# Patient Record
Sex: Female | Born: 1960 | Race: White | Hispanic: No | Marital: Single
Health system: Southern US, Community
[De-identification: ages and names within clinical notes are randomized; demographics above are authoritative.]

---

## 2002-04-09 ENCOUNTER — Encounter: Admission: RE | Admit: 2002-04-09 | Discharge: 2002-04-09 | Payer: Self-pay

## 2003-04-11 ENCOUNTER — Encounter: Admission: RE | Admit: 2003-04-11 | Discharge: 2003-04-11 | Payer: Self-pay | Admitting: Internal Medicine

## 2004-04-24 ENCOUNTER — Encounter: Admission: RE | Admit: 2004-04-24 | Discharge: 2004-04-24 | Payer: Self-pay

## 2006-09-25 ENCOUNTER — Other Ambulatory Visit: Admission: RE | Admit: 2006-09-25 | Discharge: 2006-09-25 | Payer: Self-pay | Admitting: Family Medicine

## 2006-11-18 ENCOUNTER — Encounter: Admission: RE | Admit: 2006-11-18 | Discharge: 2006-11-18 | Payer: Self-pay | Admitting: Family Medicine

## 2007-09-29 ENCOUNTER — Other Ambulatory Visit: Admission: RE | Admit: 2007-09-29 | Discharge: 2007-09-29 | Payer: Self-pay | Admitting: Family Medicine

## 2007-11-30 ENCOUNTER — Encounter: Admission: RE | Admit: 2007-11-30 | Discharge: 2007-11-30 | Payer: Self-pay | Admitting: Family Medicine

## 2008-11-10 ENCOUNTER — Other Ambulatory Visit: Admission: RE | Admit: 2008-11-10 | Discharge: 2008-11-10 | Payer: Self-pay | Admitting: Family Medicine

## 2008-12-26 ENCOUNTER — Encounter: Admission: RE | Admit: 2008-12-26 | Discharge: 2008-12-26 | Payer: Self-pay | Admitting: Family Medicine

## 2009-12-25 ENCOUNTER — Other Ambulatory Visit: Admission: RE | Admit: 2009-12-25 | Discharge: 2009-12-25 | Payer: Self-pay | Admitting: Family Medicine

## 2009-12-28 ENCOUNTER — Encounter: Admission: RE | Admit: 2009-12-28 | Discharge: 2009-12-28 | Payer: Self-pay | Admitting: Family Medicine

## 2010-06-29 ENCOUNTER — Encounter
Admission: RE | Admit: 2010-06-29 | Discharge: 2010-06-29 | Payer: Self-pay | Source: Home / Self Care | Attending: Family Medicine | Admitting: Family Medicine

## 2010-12-21 ENCOUNTER — Other Ambulatory Visit: Payer: Self-pay | Admitting: Family Medicine

## 2010-12-21 DIAGNOSIS — Z1231 Encounter for screening mammogram for malignant neoplasm of breast: Secondary | ICD-10-CM

## 2011-01-07 ENCOUNTER — Other Ambulatory Visit (HOSPITAL_COMMUNITY)
Admission: RE | Admit: 2011-01-07 | Discharge: 2011-01-07 | Disposition: A | Discharge status: Home / Self Care | Payer: Commercial Managed Care - PPO | Source: Ambulatory Visit | Attending: Family Medicine | Admitting: Family Medicine

## 2011-01-07 ENCOUNTER — Other Ambulatory Visit: Payer: Self-pay | Admitting: Family Medicine

## 2011-01-07 DIAGNOSIS — Z1159 Encounter for screening for other viral diseases: Secondary | ICD-10-CM | POA: Insufficient documentation

## 2011-01-07 DIAGNOSIS — Z124 Encounter for screening for malignant neoplasm of cervix: Secondary | ICD-10-CM | POA: Insufficient documentation

## 2011-01-21 ENCOUNTER — Ambulatory Visit
Admission: RE | Admit: 2011-01-21 | Discharge: 2011-01-21 | Disposition: A | Discharge status: Home / Self Care | Payer: Commercial Managed Care - PPO | Source: Ambulatory Visit | Attending: Family Medicine | Admitting: Family Medicine

## 2011-01-21 DIAGNOSIS — Z1231 Encounter for screening mammogram for malignant neoplasm of breast: Secondary | ICD-10-CM

## 2012-02-19 ENCOUNTER — Other Ambulatory Visit: Payer: Self-pay | Admitting: Family Medicine

## 2012-02-19 DIAGNOSIS — Z1231 Encounter for screening mammogram for malignant neoplasm of breast: Secondary | ICD-10-CM

## 2012-03-02 ENCOUNTER — Ambulatory Visit
Admission: RE | Admit: 2012-03-02 | Discharge: 2012-03-02 | Disposition: A | Discharge status: Home / Self Care | Payer: Commercial Managed Care - PPO | Source: Ambulatory Visit | Attending: Family Medicine | Admitting: Family Medicine

## 2012-03-02 DIAGNOSIS — Z1231 Encounter for screening mammogram for malignant neoplasm of breast: Secondary | ICD-10-CM

## 2014-03-30 ENCOUNTER — Other Ambulatory Visit: Payer: Self-pay | Admitting: Family Medicine

## 2014-03-30 ENCOUNTER — Other Ambulatory Visit (HOSPITAL_COMMUNITY)
Admission: RE | Admit: 2014-03-30 | Discharge: 2014-03-30 | Disposition: A | Discharge status: Home / Self Care | Payer: Commercial Managed Care - PPO | Source: Ambulatory Visit | Attending: Family Medicine | Admitting: Family Medicine

## 2014-03-30 DIAGNOSIS — Z124 Encounter for screening for malignant neoplasm of cervix: Secondary | ICD-10-CM | POA: Insufficient documentation

## 2014-03-30 DIAGNOSIS — Z1151 Encounter for screening for human papillomavirus (HPV): Secondary | ICD-10-CM | POA: Insufficient documentation

## 2014-04-01 LAB — CYTOLOGY - PAP

## 2014-04-05 ENCOUNTER — Other Ambulatory Visit: Payer: Self-pay

## 2014-04-05 DIAGNOSIS — Z1231 Encounter for screening mammogram for malignant neoplasm of breast: Secondary | ICD-10-CM

## 2014-04-28 ENCOUNTER — Ambulatory Visit
Admission: RE | Admit: 2014-04-28 | Discharge: 2014-04-28 | Disposition: A | Discharge status: Home / Self Care | Payer: Commercial Managed Care - PPO | Source: Ambulatory Visit

## 2014-04-28 ENCOUNTER — Encounter (INDEPENDENT_AMBULATORY_CARE_PROVIDER_SITE_OTHER): Payer: Self-pay

## 2014-04-28 DIAGNOSIS — Z1231 Encounter for screening mammogram for malignant neoplasm of breast: Secondary | ICD-10-CM

## 2015-06-16 ENCOUNTER — Other Ambulatory Visit: Payer: Self-pay

## 2015-06-16 DIAGNOSIS — Z1231 Encounter for screening mammogram for malignant neoplasm of breast: Secondary | ICD-10-CM

## 2015-07-07 ENCOUNTER — Ambulatory Visit
Admission: RE | Admit: 2015-07-07 | Discharge: 2015-07-07 | Disposition: A | Discharge status: Home / Self Care | Payer: Commercial Managed Care - PPO | Source: Ambulatory Visit

## 2015-07-07 DIAGNOSIS — Z1231 Encounter for screening mammogram for malignant neoplasm of breast: Secondary | ICD-10-CM

## 2016-07-15 ENCOUNTER — Other Ambulatory Visit: Payer: Self-pay | Admitting: Family Medicine

## 2016-07-15 DIAGNOSIS — Z1231 Encounter for screening mammogram for malignant neoplasm of breast: Secondary | ICD-10-CM

## 2016-07-16 ENCOUNTER — Ambulatory Visit
Admission: RE | Admit: 2016-07-16 | Discharge: 2016-07-16 | Disposition: A | Discharge status: Home / Self Care | Payer: Commercial Managed Care - PPO | Source: Ambulatory Visit | Attending: Family Medicine | Admitting: Family Medicine

## 2016-07-16 DIAGNOSIS — Z1231 Encounter for screening mammogram for malignant neoplasm of breast: Secondary | ICD-10-CM

## 2016-08-27 ENCOUNTER — Other Ambulatory Visit: Payer: Self-pay | Admitting: Family Medicine

## 2016-08-27 ENCOUNTER — Other Ambulatory Visit (HOSPITAL_COMMUNITY)
Admission: RE | Admit: 2016-08-27 | Discharge: 2016-08-27 | Disposition: A | Discharge status: Home / Self Care | Payer: Commercial Managed Care - PPO | Source: Ambulatory Visit | Attending: Family Medicine | Admitting: Family Medicine

## 2016-08-27 DIAGNOSIS — Z01411 Encounter for gynecological examination (general) (routine) with abnormal findings: Secondary | ICD-10-CM | POA: Insufficient documentation

## 2016-08-27 DIAGNOSIS — Z1151 Encounter for screening for human papillomavirus (HPV): Secondary | ICD-10-CM | POA: Diagnosis not present

## 2016-08-30 LAB — CYTOLOGY - PAP
Diagnosis: NEGATIVE
HPV (WINDOPATH): NOT DETECTED

## 2017-06-16 ENCOUNTER — Other Ambulatory Visit: Payer: Self-pay | Admitting: Family Medicine

## 2017-06-16 DIAGNOSIS — Z139 Encounter for screening, unspecified: Secondary | ICD-10-CM

## 2017-07-28 ENCOUNTER — Ambulatory Visit
Admission: RE | Admit: 2017-07-28 | Discharge: 2017-07-28 | Disposition: A | Discharge status: Home / Self Care | Payer: Commercial Managed Care - PPO | Source: Ambulatory Visit | Attending: Family Medicine | Admitting: Family Medicine

## 2017-07-28 DIAGNOSIS — Z139 Encounter for screening, unspecified: Secondary | ICD-10-CM

## 2018-06-19 ENCOUNTER — Other Ambulatory Visit: Payer: Self-pay | Admitting: Family Medicine

## 2018-06-19 DIAGNOSIS — Z1231 Encounter for screening mammogram for malignant neoplasm of breast: Secondary | ICD-10-CM

## 2018-08-10 ENCOUNTER — Ambulatory Visit
Admission: RE | Admit: 2018-08-10 | Discharge: 2018-08-10 | Disposition: A | Discharge status: Home / Self Care | Payer: Commercial Managed Care - PPO | Source: Ambulatory Visit | Attending: Family Medicine | Admitting: Family Medicine

## 2018-08-10 DIAGNOSIS — Z1231 Encounter for screening mammogram for malignant neoplasm of breast: Secondary | ICD-10-CM

## 2018-09-23 ENCOUNTER — Emergency Department (HOSPITAL_COMMUNITY): Payer: Worker's Compensation

## 2018-09-23 ENCOUNTER — Emergency Department (HOSPITAL_COMMUNITY): Payer: Worker's Compensation | Admitting: Certified Registered Nurse Anesthetist

## 2018-09-23 ENCOUNTER — Encounter (HOSPITAL_COMMUNITY)
Admission: EM | Disposition: A | Discharge status: Home / Self Care | Payer: Self-pay | Source: Home / Self Care | Attending: Emergency Medicine

## 2018-09-23 ENCOUNTER — Encounter (HOSPITAL_COMMUNITY): Payer: Self-pay

## 2018-09-23 ENCOUNTER — Other Ambulatory Visit: Payer: Self-pay

## 2018-09-23 ENCOUNTER — Ambulatory Visit (HOSPITAL_COMMUNITY)
Admission: EM | Admit: 2018-09-23 | Discharge: 2018-09-23 | Disposition: A | Discharge status: Home / Self Care | Payer: Worker's Compensation | Attending: Orthopedic Surgery | Admitting: Orthopedic Surgery

## 2018-09-23 DIAGNOSIS — Y990 Civilian activity done for income or pay: Secondary | ICD-10-CM | POA: Insufficient documentation

## 2018-09-23 DIAGNOSIS — S52502A Unspecified fracture of the lower end of left radius, initial encounter for closed fracture: Secondary | ICD-10-CM

## 2018-09-23 DIAGNOSIS — G5602 Carpal tunnel syndrome, left upper limb: Secondary | ICD-10-CM | POA: Diagnosis not present

## 2018-09-23 DIAGNOSIS — Y92511 Restaurant or cafe as the place of occurrence of the external cause: Secondary | ICD-10-CM | POA: Insufficient documentation

## 2018-09-23 DIAGNOSIS — W010XXA Fall on same level from slipping, tripping and stumbling without subsequent striking against object, initial encounter: Secondary | ICD-10-CM | POA: Insufficient documentation

## 2018-09-23 DIAGNOSIS — S52552A Other extraarticular fracture of lower end of left radius, initial encounter for closed fracture: Secondary | ICD-10-CM | POA: Diagnosis not present

## 2018-09-23 HISTORY — PX: ORIF WRIST FRACTURE: SHX2133

## 2018-09-23 HISTORY — PX: CARPAL TUNNEL RELEASE: SHX101

## 2018-09-23 SURGERY — OPEN REDUCTION INTERNAL FIXATION (ORIF) WRIST FRACTURE
Anesthesia: Monitor Anesthesia Care | Site: Wrist | Laterality: Left

## 2018-09-23 MED ORDER — SODIUM CHLORIDE 0.9 % IV BOLUS
1000.0000 mL | Freq: Once | INTRAVENOUS | Status: AC
  Administered 2018-09-23: 1000 mL via INTRAVENOUS

## 2018-09-23 MED ORDER — HYDROCODONE-ACETAMINOPHEN 5-325 MG PO TABS: 1.0000 | ORAL_TABLET | ORAL | 0 refills | Status: AC | PRN

## 2018-09-23 MED ORDER — PROPOFOL 500 MG/50ML IV EMUL
INTRAVENOUS | Status: DC | PRN
  Administered 2018-09-23: 100 ug/kg/min via INTRAVENOUS

## 2018-09-23 MED ORDER — MIDAZOLAM HCL 2 MG/2ML IJ SOLN
INTRAMUSCULAR | Status: AC
  Filled 2018-09-23: qty 2

## 2018-09-23 MED ORDER — FENTANYL CITRATE (PF) 100 MCG/2ML IJ SOLN
INTRAMUSCULAR | Status: DC | PRN
  Administered 2018-09-23: 75 ug via INTRAVENOUS
  Administered 2018-09-23: 25 ug via INTRAVENOUS

## 2018-09-23 MED ORDER — LACTATED RINGERS IV SOLN
INTRAVENOUS | Status: DC
  Administered 2018-09-23 (×2): via INTRAVENOUS

## 2018-09-23 MED ORDER — LIDOCAINE HCL (PF) 1 % IJ SOLN
10.0000 mL | Freq: Once | INTRAMUSCULAR | Status: AC
  Administered 2018-09-23: 13:00:00 10 mL via INTRADERMAL
  Filled 2018-09-23: qty 10

## 2018-09-23 MED ORDER — FENTANYL CITRATE (PF) 250 MCG/5ML IJ SOLN
INTRAMUSCULAR | Status: AC
  Filled 2018-09-23: qty 5

## 2018-09-23 MED ORDER — FENTANYL CITRATE (PF) 100 MCG/2ML IJ SOLN
INTRAMUSCULAR | Status: AC
  Filled 2018-09-23: qty 2

## 2018-09-23 MED ORDER — MIDAZOLAM HCL 5 MG/5ML IJ SOLN
INTRAMUSCULAR | Status: DC | PRN
  Administered 2018-09-23: 2 mg via INTRAVENOUS

## 2018-09-23 MED ORDER — 0.9 % SODIUM CHLORIDE (POUR BTL) OPTIME
TOPICAL | Status: DC | PRN
  Administered 2018-09-23: 17:00:00 1000 mL

## 2018-09-23 MED ORDER — MORPHINE SULFATE (PF) 4 MG/ML IV SOLN
4.0000 mg | Freq: Once | INTRAVENOUS | Status: AC
Start: 2018-09-23 — End: 2018-09-23
  Administered 2018-09-23: 13:00:00 4 mg via INTRAVENOUS
  Filled 2018-09-23: qty 1

## 2018-09-23 MED ORDER — PROPOFOL 10 MG/ML IV BOLUS
INTRAVENOUS | Status: DC | PRN
  Administered 2018-09-23 (×3): 20 mg via INTRAVENOUS
  Administered 2018-09-23 (×2): 30 mg via INTRAVENOUS
  Administered 2018-09-23 (×3): 20 mg via INTRAVENOUS

## 2018-09-23 MED ORDER — ONDANSETRON HCL 4 MG/2ML IJ SOLN
4.0000 mg | Freq: Once | INTRAMUSCULAR | Status: AC
  Administered 2018-09-23: 13:00:00 4 mg via INTRAVENOUS
  Filled 2018-09-23: qty 2

## 2018-09-23 MED ORDER — MORPHINE SULFATE (PF) 4 MG/ML IV SOLN
4.0000 mg | Freq: Once | INTRAVENOUS | Status: AC
  Administered 2018-09-23: 13:00:00 4 mg via INTRAVENOUS
  Filled 2018-09-23: qty 1

## 2018-09-23 MED ORDER — CEFAZOLIN SODIUM-DEXTROSE 2-4 GM/100ML-% IV SOLN
2.0000 g | INTRAVENOUS | Status: AC
  Administered 2018-09-23: 17:00:00 2 g via INTRAVENOUS
  Filled 2018-09-23: qty 100

## 2018-09-23 MED ORDER — LIDOCAINE HCL 1 % IJ SOLN: 10.0000 mL | Freq: Once | INTRAMUSCULAR | Status: DC

## 2018-09-23 MED ORDER — POVIDONE-IODINE 10 % EX SWAB: 2.0000 "application " | Freq: Once | CUTANEOUS | Status: DC

## 2018-09-23 MED ORDER — CHLORHEXIDINE GLUCONATE 4 % EX LIQD
60.0000 mL | Freq: Once | CUTANEOUS | Status: DC
  Filled 2018-09-23: qty 60

## 2018-09-23 MED ORDER — PROPOFOL 10 MG/ML IV BOLUS
INTRAVENOUS | Status: AC
  Filled 2018-09-23: qty 20

## 2018-09-23 SURGICAL SUPPLY — 71 items
BANDAGE ACE 3X5.8 VEL STRL LF (GAUZE/BANDAGES/DRESSINGS) ×3 IMPLANT
BANDAGE ACE 4X5 VEL STRL LF (GAUZE/BANDAGES/DRESSINGS) ×3 IMPLANT
BIT DRILL 2.0 LNG QUCK RELEASE (BIT) ×1 IMPLANT
BIT DRILL 2.8 QUICK RELEASE (BIT) ×1 IMPLANT
BLADE CLIPPER SURG (BLADE) IMPLANT
BNDG CMPR 9X4 STRL LF SNTH (GAUZE/BANDAGES/DRESSINGS) ×1
BNDG ESMARK 4X9 LF (GAUZE/BANDAGES/DRESSINGS) ×3 IMPLANT
BNDG GAUZE ELAST 4 BULKY (GAUZE/BANDAGES/DRESSINGS) ×3 IMPLANT
CANISTER SUCT 3000ML PPV (MISCELLANEOUS) ×3 IMPLANT
CORDS BIPOLAR (ELECTRODE) ×3 IMPLANT
COVER SURGICAL LIGHT HANDLE (MISCELLANEOUS) ×3 IMPLANT
COVER WAND RF STERILE (DRAPES) ×3 IMPLANT
CUFF TOURNIQUET SINGLE 18IN (TOURNIQUET CUFF) ×3 IMPLANT
CUFF TOURNIQUET SINGLE 24IN (TOURNIQUET CUFF) IMPLANT
DRAIN TLS ROUND 10FR (DRAIN) IMPLANT
DRAPE OEC MINIVIEW 54X84 (DRAPES) ×2 IMPLANT
DRAPE SURG 17X23 STRL (DRAPES) ×3 IMPLANT
DRILL 2.0 LNG QUICK RELEASE (BIT) ×3
DRILL 2.8 QUICK RELEASE (BIT) ×3
DRSG XEROFORM 1X8 (GAUZE/BANDAGES/DRESSINGS) ×2 IMPLANT
GAUZE SPONGE 4X4 12PLY STRL (GAUZE/BANDAGES/DRESSINGS) ×3 IMPLANT
GAUZE XEROFORM 1X8 LF (GAUZE/BANDAGES/DRESSINGS) ×1 IMPLANT
GLOVE BIOGEL M 8.0 STRL (GLOVE) ×3 IMPLANT
GLOVE SS BIOGEL STRL SZ 8 (GLOVE) ×1 IMPLANT
GLOVE SUPERSENSE BIOGEL SZ 8 (GLOVE) ×2
GLOVE SURG SS PI 7.5 STRL IVOR (GLOVE) ×4 IMPLANT
GOWN STRL REUS W/ TWL LRG LVL3 (GOWN DISPOSABLE) ×1 IMPLANT
GOWN STRL REUS W/ TWL XL LVL3 (GOWN DISPOSABLE) ×2 IMPLANT
GOWN STRL REUS W/TWL LRG LVL3 (GOWN DISPOSABLE) ×3
GOWN STRL REUS W/TWL XL LVL3 (GOWN DISPOSABLE) ×6
GUIDEWIRE ORTHO 0.054X6 (WIRE) ×6 IMPLANT
KIT BASIN OR (CUSTOM PROCEDURE TRAY) ×3 IMPLANT
KIT TURNOVER KIT B (KITS) ×3 IMPLANT
LOOP VESSEL MAXI BLUE (MISCELLANEOUS) IMPLANT
MANIFOLD NEPTUNE II (INSTRUMENTS) ×3 IMPLANT
NEEDLE 22X1 1/2 (OR ONLY) (NEEDLE) IMPLANT
NS IRRIG 1000ML POUR BTL (IV SOLUTION) ×3 IMPLANT
PACK ORTHO EXTREMITY (CUSTOM PROCEDURE TRAY) ×3 IMPLANT
PAD ARMBOARD 7.5X6 YLW CONV (MISCELLANEOUS) ×6 IMPLANT
PAD CAST 3X4 CTTN HI CHSV (CAST SUPPLIES) ×1 IMPLANT
PAD CAST 4YDX4 CTTN HI CHSV (CAST SUPPLIES) ×1 IMPLANT
PADDING CAST COTTON 3X4 STRL (CAST SUPPLIES) ×3
PADDING CAST COTTON 4X4 STRL (CAST SUPPLIES) ×3
PLATE ACU LOC PROX STD LEFT (Plate) ×2 IMPLANT
SCREW 2.3X12MM (Screw) ×2 IMPLANT
SCREW BN FT 16X2.3XLCK HEX CRT (Screw) IMPLANT
SCREW CORT FT 18X2.3XLCK HEX (Screw) IMPLANT
SCREW CORT FT 20X2.3XLCK HEX (Screw) IMPLANT
SCREW CORTICAL LOCKING 2.3X16M (Screw) ×3 IMPLANT
SCREW CORTICAL LOCKING 2.3X18M (Screw) ×3 IMPLANT
SCREW CORTICAL LOCKING 2.3X20M (Screw) ×3 IMPLANT
SCREW HEXALOBE LOCKING 3.5X14M (Screw) ×2 IMPLANT
SCREW LOCK 12X3.5X HEXALOBE (Screw) ×1 IMPLANT
SCREW LOCKING 3.5X12 (Screw) ×3 IMPLANT
SCREW NON TOGGLE 2.3X26 (Screw) ×2 IMPLANT
SCREW NONLOCK HEX 3.5X12 (Screw) ×2 IMPLANT
SCRUB BETADINE 4OZ XXX (MISCELLANEOUS) ×3 IMPLANT
SOL PREP POV-IOD 4OZ 10% (MISCELLANEOUS) ×3 IMPLANT
SPONGE LAP 4X18 RFD (DISPOSABLE) IMPLANT
SUT MNCRL AB 4-0 PS2 18 (SUTURE) ×3 IMPLANT
SUT PROLENE 3 0 PS 2 (SUTURE) IMPLANT
SUT PROLENE 4 0 PS 2 18 (SUTURE) ×4 IMPLANT
SUT VIC AB 3-0 FS2 27 (SUTURE) IMPLANT
SYR CONTROL 10ML LL (SYRINGE) IMPLANT
SYSTEM CHEST DRAIN TLS 7FR (DRAIN) IMPLANT
TOWEL OR 17X24 6PK STRL BLUE (TOWEL DISPOSABLE) ×3 IMPLANT
TOWEL OR 17X26 10 PK STRL BLUE (TOWEL DISPOSABLE) ×3 IMPLANT
TUBE CONNECTING 12'X1/4 (SUCTIONS) ×1
TUBE CONNECTING 12X1/4 (SUCTIONS) ×2 IMPLANT
TUBE EVACUATION TLS (MISCELLANEOUS) ×3 IMPLANT
WATER STERILE IRR 1000ML POUR (IV SOLUTION) ×3 IMPLANT

## 2018-09-23 NOTE — Consult Note (Addendum)
Reason for Consult:Left wrist fx Referring Physician: Cindie LarocheJ Haviland  Savannah Juarez is an 58 y.o. female.  HPI: Savannah Juarez was working at [employer] when she slipped on a drink lid and fell. She put out her left hand to break her fall and fell onto her outstretched hand. She had immediate pain and came to the ED for evaluation. X-rays showed a distal radius fx and hand surgery was consulted. She is LHD.  History reviewed. No pertinent past medical history.  History reviewed. No pertinent surgical history.  Family History  Problem Relation Age of Onset  . Breast cancer Neg Hx     Social History:  has no history on file for tobacco, alcohol, and drug.  Allergies: No Known Allergies  Medications: I have reviewed the patient's current medications.  No results found for this or any previous visit (from the past 48 hour(s)).  Dg Wrist Complete Left  Result Date: 09/23/2018 CLINICAL DATA:  Larey SeatFell on left breast today with deformity. EXAM: LEFT WRIST - COMPLETE 3+ VIEW COMPARISON:  None. FINDINGS: There is a moderately displaced transverse fracture with mild comminution of the distal radial metaphysis with associated impaction. Moderate dorsal angulation of the predominant distal fracture fragment. Degenerative change over the radial side of the carpal bones. IMPRESSION: Displaced slightly comminuted distal radial metaphyseal fracture. Electronically Signed   By: Elberta Fortisaniel  Boyle M.D.   On: 09/23/2018 12:36    Review of Systems  Constitutional: Negative for weight loss.  HENT: Negative for ear discharge, ear pain, hearing loss and tinnitus.   Eyes: Negative for blurred vision, double vision, photophobia and pain.  Respiratory: Negative for cough, sputum production and shortness of breath.   Cardiovascular: Negative for chest pain.  Gastrointestinal: Negative for abdominal pain, nausea and vomiting.  Genitourinary: Negative for dysuria, flank pain, frequency and urgency.  Musculoskeletal: Positive  for joint pain (Left wrist). Negative for back pain, falls, myalgias and neck pain.  Neurological: Negative for dizziness, tingling, sensory change, focal weakness, loss of consciousness and headaches.  Endo/Heme/Allergies: Does not bruise/bleed easily.  Psychiatric/Behavioral: Negative for depression, memory loss and substance abuse. The patient is not nervous/anxious.    Blood pressure (!) 126/96, pulse 77, temperature 98.2 F (36.8 C), temperature source Oral, resp. rate 16, last menstrual period 07/16/2016, SpO2 98 %. Physical Exam  Constitutional: She appears well-developed and well-nourished. No distress.  HENT:  Head: Normocephalic and atraumatic.  Eyes: Conjunctivae are normal. Right eye exhibits no discharge. Left eye exhibits no discharge. No scleral icterus.  Neck: Normal range of motion.  Cardiovascular: Normal rate and regular rhythm.  Respiratory: Effort normal. No respiratory distress.  Musculoskeletal:     Comments: Left shoulder, elbow, wrist, digits- no skin wounds, wrist exquisitely TTP and pain with AROM/PROM, deformed  Sens  Ax/R/M/U intact  Mot   Ax/ R/ PIN/ M/ AIN/ U intact  Rad 2+  Neurological: She is alert.  Skin: Skin is warm and dry. She is not diaphoretic.  Psychiatric: She has a normal mood and affect. Her behavior is normal.    Assessment/Plan: Left wrist fx -- Plan for ORIF this afternoon by Dr. Roney Mansreighton. NPO until then. Anticipate discharge after surgery. HTN    Freeman CaldronMichael J. Jeffery, PA-C Orthopedic Surgery [phone] 09/23/2018, 1:24 PM     ** Agree with note as stated above with the following modification: Patient states that since the fall she has had paresthesia to the median nerve distribution. This has not resolved since the injury. We will plan to proceed  with L distal radius open reduction and internal fixation and a left carpal tunnel release for acute carpal tunnel syndrome. The risk, benefits and alternatives were discussed with her in  the preoperative area.  These include but are not limited to infection, bleeding, damage to surrounding structures occluding blood vessels nerves, pains, stiffness, malunion, nonunion, hardware failure and need for additional procedures.  Informed consent was obtained and the patient's left wrist was marked with surgical marking pen.  We will plan on discharge home postoperatively and follow-up with me in approximately 10 to 14 days.

## 2018-09-23 NOTE — Progress Notes (Signed)
Orthopedic Tech Progress Note Patient Details:  Savannah Juarez 18-Aug-1960 [id number]  Ortho Devices Type of Ortho Device: Short arm splint Ortho Device/Splint Interventions: Ordered, Application, Adjustment   Post Interventions Patient Tolerated: Well   Caterra Ostroff Danella Penton 09/23/2018, 2:05 PM

## 2018-09-23 NOTE — ED Notes (Signed)
Belongings placed in locker 6 in PURPLE ZONE. Valuables with security

## 2018-09-23 NOTE — ED Triage Notes (Signed)
Pt reports slipped on lid at work and fell onto buttocks, pt tried to catch self on left hand. Pt left wrist is swollen. Pt denies hitting head and denies LOC. Pt alert and oriented.

## 2018-09-23 NOTE — ED Notes (Signed)
Patient transported to X-ray 

## 2018-09-23 NOTE — Transfer of Care (Signed)
Immediate Anesthesia Transfer of Care Note  Patient: Savannah Juarez  Procedure(s) Performed: OPEN REDUCTION INTERNAL FIXATION (ORIF) WRIST FRACTURE (Left Wrist) Carpal Tunnel Release (Left Wrist)  Patient Location: PACU  Anesthesia Type:Regional  Level of Consciousness: awake  Airway & Oxygen Therapy: Patient Spontanous Breathing and Patient connected to face mask oxygen  Post-op Assessment: Report given to RN and Post -op Vital signs reviewed and stable  Post vital signs: Reviewed and stable  Last Vitals:  Vitals Value Taken Time  BP 131/66 09/23/2018  5:58 PM  Temp    Pulse 87 09/23/2018  6:00 PM  Resp 20 09/23/2018  6:00 PM  SpO2 98 % 09/23/2018  6:00 PM  Vitals shown include unvalidated device data.  Last Pain:  Vitals:   09/23/18 1216  TempSrc:   PainSc: 9          Complications: No apparent anesthesia complications

## 2018-09-23 NOTE — ED Notes (Signed)
Ortho paged. 

## 2018-09-23 NOTE — ED Notes (Signed)
M. Jeffery, PA at bedside 

## 2018-09-23 NOTE — Op Note (Signed)
PREOPERATIVE DIAGNOSIS: 1.  Left displaced distal radius fracture, extra-articular 2.  Acute left carpal tunnel syndrome  POSTOPERATIVE DIAGNOSIS: Same  ATTENDING PHYSICIAN: Maudry Mayhew. Jeannie Fend, III, MD who was present and scrubbed for the entire case   ASSISTANT SURGEON: None.   ANESTHESIA: Regional with MAC sedation  SURGICAL PROCEDURES: 1.  Open reduction internal fixation left displaced extra-articular distal radius fracture 2.  Left carpal tunnel release  SURGICAL INDICATIONS: Patient is a 58 year old female who presented to the [facility] ER earlier today.  She had a fall while at work and landed on outstretched left arm.  She was found to have a significantly displaced extra-articular distal radius fracture.  Additionally she states that since the fall she has had numbness and tingling to the thumb, index and middle fingers.  She denies any numbness or tingling prior to the fall and has failed to improve since the injury.  I discussed treatment options for her and did recommend proceeding forward with open reduction and internal fixation of her distal radius fracture.  Additionally we did discuss the risks and benefits of a left carpal tunnel release to alleviate the symptoms of her thumb index and middle finger paresthesias.  Through this discussion she did agree to proceed with both.  FINDING: Patient had a significantly displaced left extra-articular distal radius fracture.  This was subsequently reduced to near anatomic alignment and held in place with a Acumed volar locking plate.  Additionally, left carpal tunnel release was performed with mild compression of the median nerve within the carpal tunnel  DESCRIPTION OF PROCEDURE: Patient was identified in the preoperative holding area where the risk benefits and alternatives of the procedure were discussed with the patient.  These include but are not limited to infection, bleeding, damage to surrounding structures including blood  vessels and nerves, pain, stiffness, hardware failure, malunion, nonunion and need for additional procedures.  Informed consent was obtained at that time patient's left wrist was marked with surgical marking pen.  She then had a left upper extremity plexus block performed by anesthesia.  She was subsequently brought to the operative suite where a timeout was performed identifying the correct patient operative site.  She was positioned supine operative table with her hand outstretched on a hand table.  A tourniquet was placed on the upper arm and the patient was induced under MAC sedation.  Left upper extremity was then prepped and draped in usual sterile fashion  The limb was exsanguinated and the tourniquet was inflated.  A standard volar FCR approach was then utilized with a longitudinal incision overlying the FCR tendon.  Sharp dissection was carried down through the subcutaneous tissue to the level of the tendon which had its sheath incised.  The tendon was mobilized medially and the sub-sheath was once again incised with a 15 blade.  The underlying FPL tendon was mobilized medially as well exposing the deep distal radius and its fracture.  The pronator quadratus was elevated off the distal radius both proximal and distal to the fracture site.  With longitudinal traction, ulnar deviation and dorsal pressure on the proximal fragment the radius fracture was reduced and pinned in the place with a 0.054 K wire.  Fluoroscopic images were obtained which showed near anatomic reduction.  The Acumed volar locking plate was then placed in appropriate position which was confirmed under fluoroscopic imaging.  A single distal bicortical screw was placed to reduce the plate down to the bone.  All holes distally were then filled using unicortical  locking screws in standard fashion.  The previously placed bicortical screw was replaced with a locking unicortical screw.  The plate was then reduced to the bone proximately  utilizing a kickstand technique to achieve further volar angulation of the fracture.  1 cortical screw followed by 2 locking screws were then placed securing the plate to the proximal shaft.  Fluoroscopic images were obtained which showed near anatomic reduction of the distal radius fracture as well as appropriate plate positioning and screw length dorsally.  There was a single locking screw in the radial styloid which was prominent and removed.  The DRUJ was evaluated in both pronation and supination and found to be stable.  The wound was then copiously irrigated with normal saline and the skin was closed with interrupted 4-0 Prolene sutures.  Attention was then turned to the palm.  A longitudinal incision was made in the palm just distal to the flexion crease in line with the fourth digit.  Dissection was carried down through the subcutaneous tissue and the palmar aponeurosis was divided longitudinally in line with the incision.  Deep to this the transverse carpal ligament was identified and carefully transected from distal to proximal.  Care was taken to protect the deep median nerve as well as the flexor tendons.  Once the ligament had been released up to the level of the skin incision the Indianatome's were utilized in sequential fashion dilating the remainder of the carpal tunnel.  The Biomet carpal tunnel release device was then inserted and the blade was advanced in a controlled fashion transecting the remainder of the proximal carpal tunnel.  A Valora Corporal was used to visualize the release and found to have complete release of the transverse carpal ligament.  There was no damage to the deep neurologic or tendinous structures.  This wound was then copiously irrigated with normal saline and the skin was closed with interrupted 4-0 Prolene's.  Xeroform, 4 x 4's and a well-padded volar splint were then applied.  The tourniquet was released and the patient had return of brisk cap refill to all of her digits.  Awoken  from her anesthesia and taken to the PACU in stable condition.  She tolerated the procedure well and there were no complications.  RADIOGRAPHIC INTERPRETATION: 3 views of the left wrist were obtained intraoperatively under fluoroscopic image guidance including AP, lateral and fossa lateral views.  These show interval near anatomic reduction of the distal radius fracture with volar plate placement.  All screw lengths appear to be appropriate and extra-articular.  There are no other fractures or dislocations noted.  ESTIMATED BLOOD LOSS: 10 mL's  TOURNIQUET TIME: 62 minutes  SPECIMENS: None  POSTOPERATIVE PLAN: The patient will be discharged home and seen back  in the office in approximately 10-12 days for wound check, suture  removal, and then be sent to a therapist for edema control, early range of motion and volar wrist splint  IMPLANTS: Acumed volar locking plate with cortical and locking screws

## 2018-09-23 NOTE — ED Notes (Signed)
Boss's number for workers comp - [phone]. Name - Savannah Juarez

## 2018-09-23 NOTE — Discharge Instructions (Signed)
Discharge Instructions  - Keep dressings in place. Do not remove them. - The dressings must stay dry - Take all medication as prescribed. Transition to over the counter pain medication as your pain improves - Keep the hand elevated over the next 48-72 hours to help with pain and swelling - Move all digits not restricted by the dressings regularly to prevent stiffness - Please call to schedule a follow up appointment with Dr. Roney Mans at [phone] for 10-14 days following surgery\ - Your pain medication have been sent digitally to your pharmacy

## 2018-09-23 NOTE — Anesthesia Procedure Notes (Signed)
Anesthesia Regional Block: Supraclavicular block   Pre-Anesthetic Checklist: ,, timeout performed, Correct Patient, Correct Site, Correct Laterality, Correct Procedure, Correct Position, site marked, Risks and benefits discussed,  Surgical consent,  Pre-op evaluation,  At surgeon's request and post-op pain management  Laterality: Left  Prep: chloraprep       Needles:  Injection technique: Single-shot  Needle Type: Stimulator Needle - 80          Additional Needles:   Procedures:, nerve stimulator,,, ultrasound used (permanent image in chart),,,,  Narrative:  Start time: 09/23/2018 3:55 PM End time: 09/23/2018 4:05 PM Injection made incrementally with aspirations every 5 mL.  Performed by: Personally   Additional Notes: 20 cc 0.75% Ropivacaine injected easily

## 2018-09-23 NOTE — Anesthesia Preprocedure Evaluation (Signed)
Anesthesia Evaluation  Patient identified by MRN, date of birth, ID band Patient awake    Reviewed: Allergy & Precautions, NPO status , Patient's Chart, lab work & pertinent test results  Airway Mallampati: II  TM Distance: >3 FB Neck ROM: Full    Dental  (+) Teeth Intact, Dental Advisory Given   Pulmonary    breath sounds clear to auscultation       Cardiovascular  Rhythm:Regular Rate:Normal     Neuro/Psych    GI/Hepatic   Endo/Other    Renal/GU      Musculoskeletal   Abdominal   Peds  Hematology   Anesthesia Other Findings   Reproductive/Obstetrics                             Anesthesia Physical Anesthesia Plan  ASA: II  Anesthesia Plan: MAC and Regional   Post-op Pain Management:    Induction: Intravenous  PONV Risk Score and Plan: Ondansetron and Dexamethasone  Airway Management Planned: Simple Face Mask and Natural Airway  Additional Equipment:   Intra-op Plan:   Post-operative Plan:   Informed Consent: I have reviewed the patients History and Physical, chart, labs and discussed the procedure including the risks, benefits and alternatives for the proposed anesthesia with the patient or authorized representative who has indicated his/her understanding and acceptance.     Dental advisory given  Plan Discussed with: CRNA and Anesthesiologist  Anesthesia Plan Comments:         Anesthesia Quick Evaluation

## 2018-09-23 NOTE — ED Provider Notes (Signed)
[facility] EMERGENCY DEPARTMENT Provider Note   CSN: [id number] Arrival date & time: 09/23/18  1203    History   Chief Complaint Chief Complaint  Patient presents with  . Hand Injury    HPI Atiyana C Juarez is a 58 y.o. female.     Pt presents to the ED today with left wrist pain.  The pt said she slipped while at work and landed on her left wrist.  The pt denies any other injury.     History reviewed. No pertinent past medical history.  There are no active problems to display for this patient.   History reviewed. No pertinent surgical history.   OB History   No obstetric history on file.      Home Medications    Prior to Admission medications   Not on File    Family History Family History  Problem Relation Age of Onset  . Breast cancer Neg Hx     Social History Social History   Tobacco Use  . Smoking status: Not on file  Substance Use Topics  . Alcohol use: Not on file  . Drug use: Not on file     Allergies   Patient has no known allergies.   Review of Systems Review of Systems  Musculoskeletal:       Wrist pain  All other systems reviewed and are negative.    Physical Exam Updated Vital Signs BP (!) 126/96 (BP Location: Right Arm)   Pulse 77   Temp 98.2 F (36.8 C) (Oral)   Resp 16   LMP 07/16/2016 (Exact Date)   SpO2 98%   Physical Exam Vitals signs and nursing note reviewed.  Constitutional:      Appearance: Normal appearance.  HENT:     Head: Normocephalic and atraumatic.     Right Ear: External ear normal.     Left Ear: External ear normal.     Nose: Nose normal.     Mouth/Throat:     Mouth: Mucous membranes are moist.  Eyes:     Extraocular Movements: Extraocular movements intact.     Conjunctiva/sclera: Conjunctivae normal.     Pupils: Pupils are equal, round, and reactive to light.  Neck:     Musculoskeletal: Normal range of motion and neck supple.  Cardiovascular:     Rate and Rhythm:  Normal rate and regular rhythm.     Pulses: Normal pulses.     Heart sounds: Normal heart sounds.  Pulmonary:     Effort: Pulmonary effort is normal.     Breath sounds: Normal breath sounds.  Abdominal:     General: Abdomen is flat. Bowel sounds are normal.     Palpations: Abdomen is soft.  Musculoskeletal:     Left wrist: She exhibits decreased range of motion, tenderness and deformity.  Skin:    General: Skin is warm.     Capillary Refill: Capillary refill takes less than 2 seconds.  Neurological:     General: No focal deficit present.     Mental Status: She is alert and oriented to person, place, and time.  Psychiatric:        Mood and Affect: Mood normal.      ED Treatments / Results  Labs (all labs ordered are listed, but only abnormal results are displayed) Labs Reviewed - No data to display  EKG None  Radiology Dg Wrist Complete Left  Result Date: 09/23/2018 CLINICAL DATA:  Larey Seat on left breast today with deformity. EXAM:  LEFT WRIST - COMPLETE 3+ VIEW COMPARISON:  None. FINDINGS: There is a moderately displaced transverse fracture with mild comminution of the distal radial metaphysis with associated impaction. Moderate dorsal angulation of the predominant distal fracture fragment. Degenerative change over the radial side of the carpal bones. IMPRESSION: Displaced slightly comminuted distal radial metaphyseal fracture. Electronically Signed   By: Elberta Fortisaniel  Boyle M.D.   On: 09/23/2018 12:36    Procedures Procedures (including critical care time)  Medications Ordered in ED Medications  morphine 4 MG/ML injection 4 mg (4 mg Intravenous Given 09/23/18 1241)  ondansetron (ZOFRAN) injection 4 mg (4 mg Intravenous Given 09/23/18 1240)  sodium chloride 0.9 % bolus 1,000 mL (1,000 mLs Intravenous New Bag/Given 09/23/18 1245)  lidocaine (PF) (XYLOCAINE) 1 % injection 10 mL (10 mLs Intradermal Given 09/23/18 1305)  morphine 4 MG/ML injection 4 mg (4 mg Intravenous Given 09/23/18  1314)     Initial Impression / Assessment and Plan / ED Course  I have reviewed the triage vital signs and the nursing notes.  Pertinent labs & imaging results that were available during my care of the patient were reviewed by me and considered in my medical decision making (see chart for details).       Pt d/w Charma IgoMichael Jeffery (hand) who will take pt to the OR today.  He also gave her a hematoma block to help with the pain.  Final Clinical Impressions(s) / ED Diagnoses   Final diagnoses:  Closed fracture of distal end of left radius, unspecified fracture morphology, initial encounter    ED Discharge Orders    None       Jacalyn LefevreHaviland, Anysha Frappier, MD 09/23/18 1321

## 2018-09-24 ENCOUNTER — Encounter (HOSPITAL_COMMUNITY): Payer: Self-pay | Admitting: Orthopaedic Surgery

## 2018-09-24 NOTE — Anesthesia Postprocedure Evaluation (Signed)
Anesthesia Post Note  Patient: Timeka C Lacek  Procedure(s) Performed: OPEN REDUCTION INTERNAL FIXATION (ORIF) WRIST FRACTURE (Left Wrist) Carpal Tunnel Release (Left Wrist)     Patient location during evaluation: PACU Anesthesia Type: Regional Level of consciousness: awake and alert Pain management: pain level controlled Vital Signs Assessment: post-procedure vital signs reviewed and stable Respiratory status: spontaneous breathing, nonlabored ventilation, respiratory function stable and patient connected to nasal cannula oxygen Cardiovascular status: stable and blood pressure returned to baseline Postop Assessment: no apparent nausea or vomiting Anesthetic complications: no    Last Vitals:  Vitals:   09/23/18 1845 09/23/18 1856  BP: 137/69 138/74  Pulse: 82 85  Resp: 14 19  Temp:  (!) 36.1 C  SpO2: 95% 100%    Last Pain:  Vitals:   09/23/18 1845  TempSrc:   PainSc: 0-No pain                 Dayanira Giovannetti COKER

## 2019-08-05 ENCOUNTER — Other Ambulatory Visit: Payer: Self-pay | Admitting: Family Medicine

## 2019-08-05 DIAGNOSIS — Z1231 Encounter for screening mammogram for malignant neoplasm of breast: Secondary | ICD-10-CM

## 2019-08-20 ENCOUNTER — Ambulatory Visit
Admission: RE | Admit: 2019-08-20 | Discharge: 2019-08-20 | Disposition: A | Discharge status: Home / Self Care | Payer: Commercial Managed Care - PPO | Source: Ambulatory Visit | Attending: Family Medicine | Admitting: Family Medicine

## 2019-08-20 ENCOUNTER — Other Ambulatory Visit: Payer: Self-pay

## 2019-08-20 DIAGNOSIS — Z1231 Encounter for screening mammogram for malignant neoplasm of breast: Secondary | ICD-10-CM

## 2020-06-23 ENCOUNTER — Other Ambulatory Visit: Payer: Commercial Managed Care - PPO

## 2020-06-23 DIAGNOSIS — Z20822 Contact with and (suspected) exposure to covid-19: Secondary | ICD-10-CM

## 2020-06-27 ENCOUNTER — Telehealth: Payer: Self-pay

## 2020-06-27 LAB — NOVEL CORONAVIRUS, NAA: SARS-CoV-2, NAA: DETECTED — AB

## 2020-06-27 NOTE — Telephone Encounter (Signed)
Patient called and she was informed that her test for COVID-19 done 06/23/20 was positive and she can pass the germ to others.  She states she was sick Wednesday but no symptoms now. Symptom tiers and treatment plans for each were reviewed. Criteria for ending isolation 5 days isolated 24 hours with out fever and not taking fever reducing medications respiratory symptoms improved.  Then 5 days wear a mask at app times when around others.  Good preventative practices were discussed.  She verbalized understanding of all information. Per previous documentation [place]  [facility] has been notified

## 2020-08-18 ENCOUNTER — Other Ambulatory Visit: Payer: Self-pay | Admitting: Family Medicine

## 2020-08-18 DIAGNOSIS — Z1231 Encounter for screening mammogram for malignant neoplasm of breast: Secondary | ICD-10-CM

## 2020-10-12 ENCOUNTER — Other Ambulatory Visit: Payer: Self-pay

## 2020-10-12 ENCOUNTER — Ambulatory Visit
Admission: RE | Admit: 2020-10-12 | Discharge: 2020-10-12 | Disposition: A | Discharge status: Home / Self Care | Payer: Commercial Managed Care - PPO | Source: Ambulatory Visit | Attending: Family Medicine | Admitting: Family Medicine

## 2020-10-12 DIAGNOSIS — Z1231 Encounter for screening mammogram for malignant neoplasm of breast: Secondary | ICD-10-CM

## 2021-10-11 ENCOUNTER — Other Ambulatory Visit: Payer: Self-pay | Admitting: Family Medicine

## 2021-10-11 DIAGNOSIS — Z1231 Encounter for screening mammogram for malignant neoplasm of breast: Secondary | ICD-10-CM

## 2021-10-31 ENCOUNTER — Ambulatory Visit
Admission: RE | Admit: 2021-10-31 | Discharge: 2021-10-31 | Disposition: A | Discharge status: Home / Self Care | Payer: Commercial Managed Care - PPO | Source: Ambulatory Visit | Attending: Family Medicine | Admitting: Family Medicine

## 2021-10-31 DIAGNOSIS — Z1231 Encounter for screening mammogram for malignant neoplasm of breast: Secondary | ICD-10-CM

## 2022-02-03 IMAGING — MG MM DIGITAL SCREENING BILAT W/ TOMO AND CAD
6 of 10 series · 6 of 30 positions shown · non-contrast
Comparison: Previous exam(s).

CLINICAL DATA: Screening.

EXAM:
DIGITAL SCREENING BILATERAL MAMMOGRAM WITH TOMOSYNTHESIS AND CAD
TECHNIQUE: Bilateral screening digital craniocaudal and mediolateral oblique
mammograms were obtained. Bilateral screening digital breast
tomosynthesis was performed. The images were evaluated with
computer-aided detection.

[R MLO synth-2D (1 of 2)]
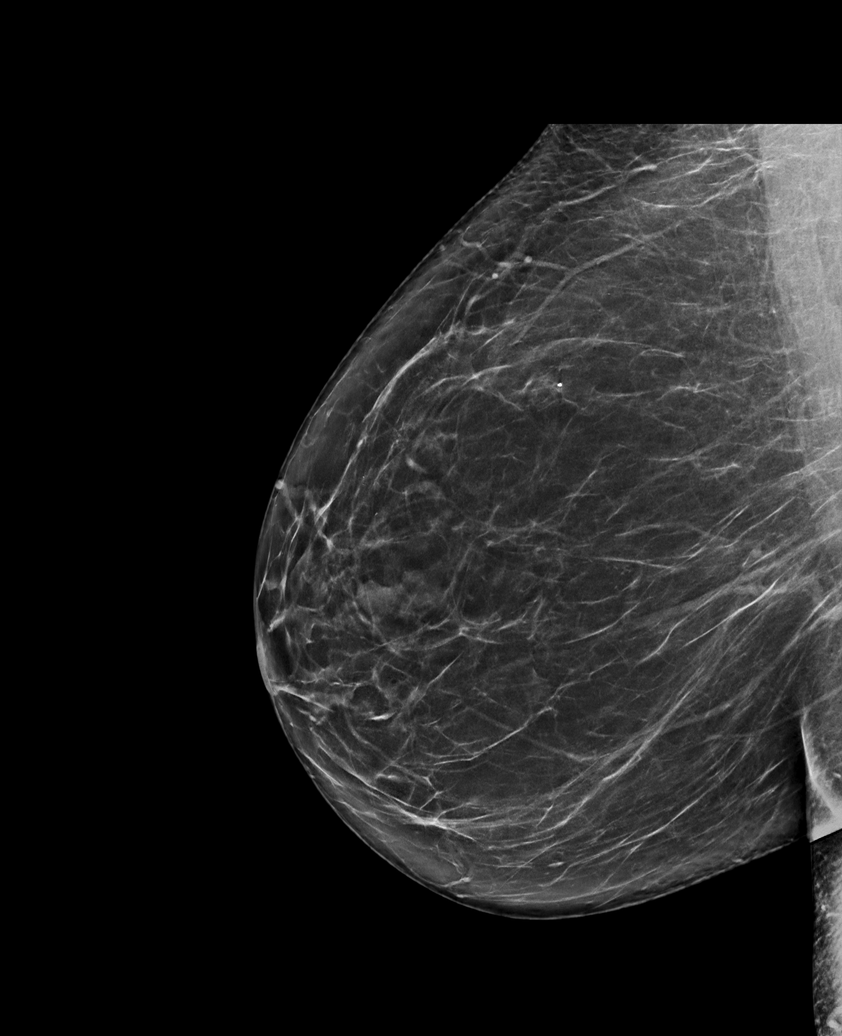

[L MLO synth-2D]
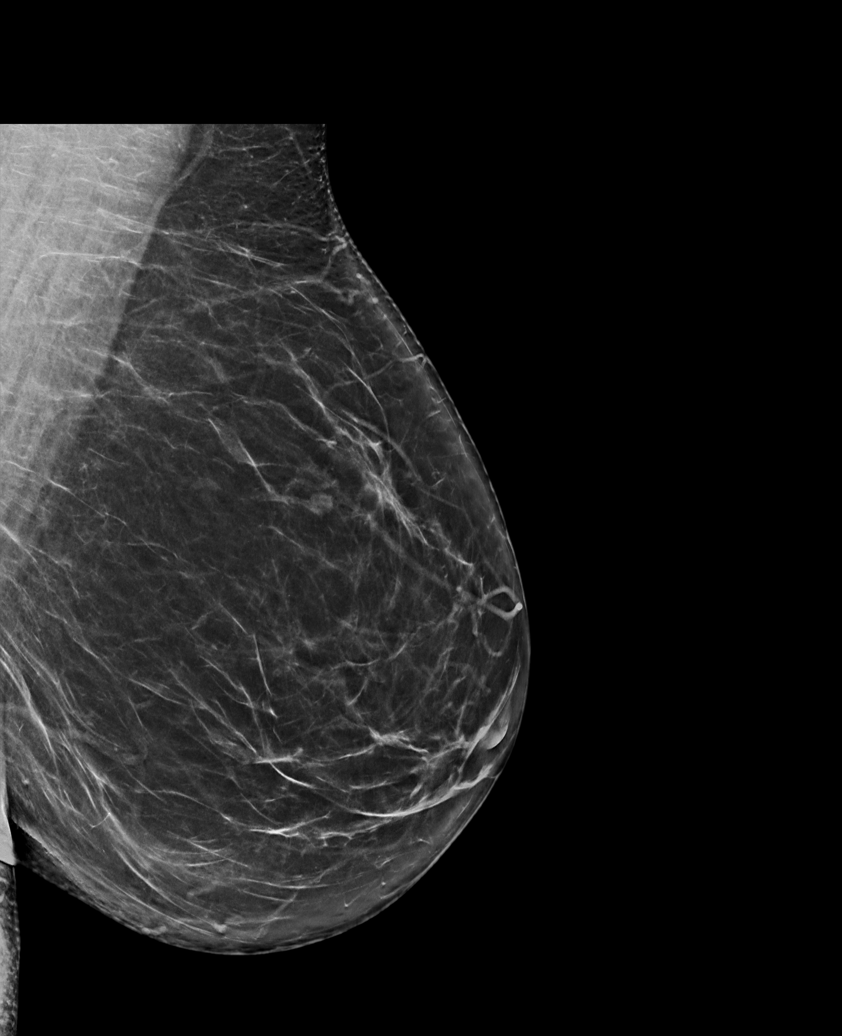

[L CC synth-2D]
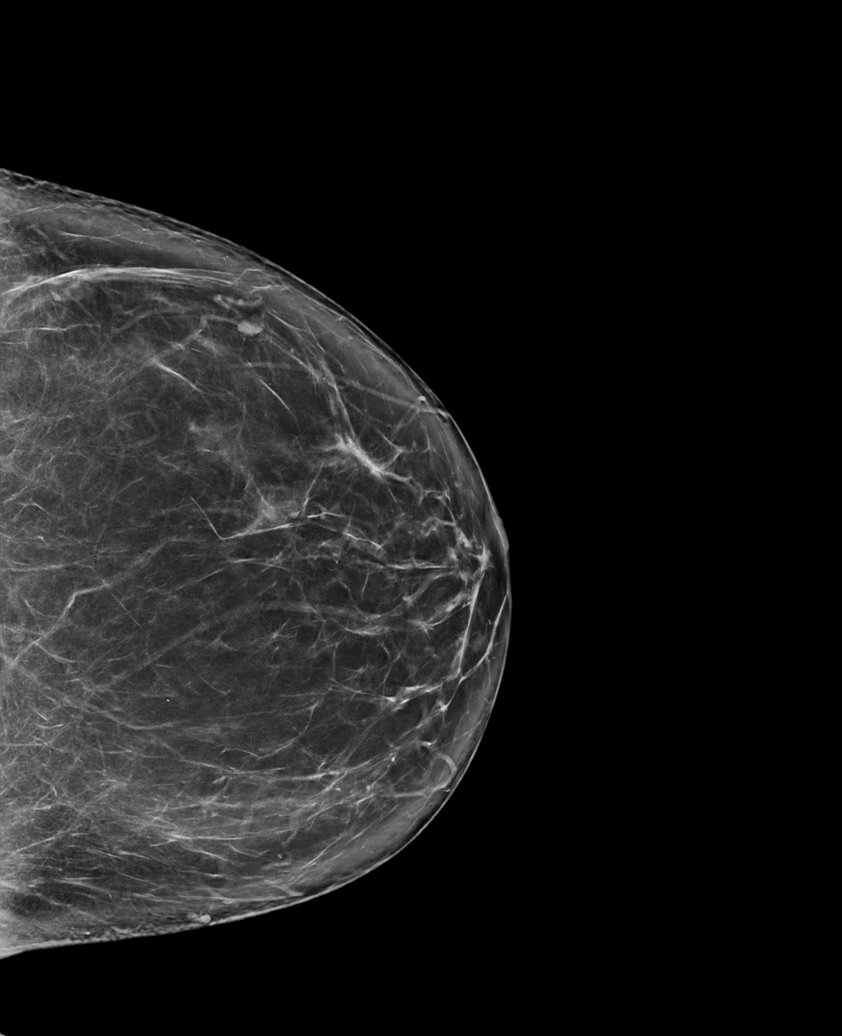

[R MLO synth-2D (2 of 2)]
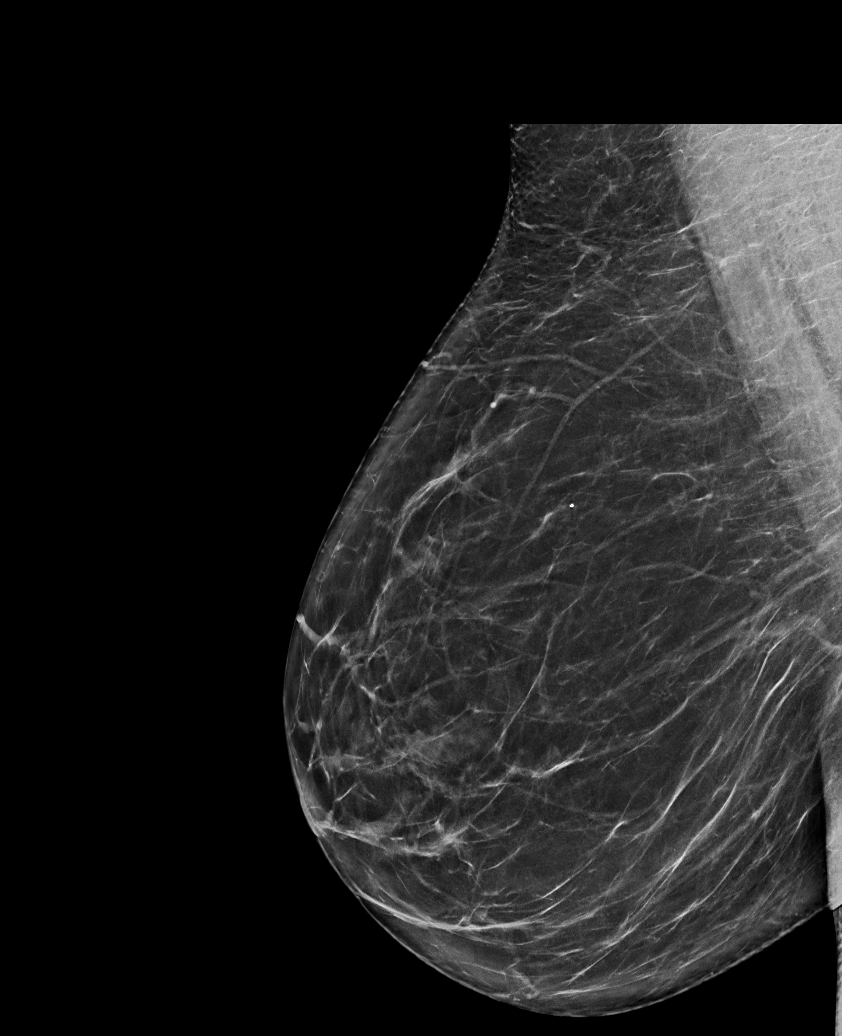

[R CC synth-2D]
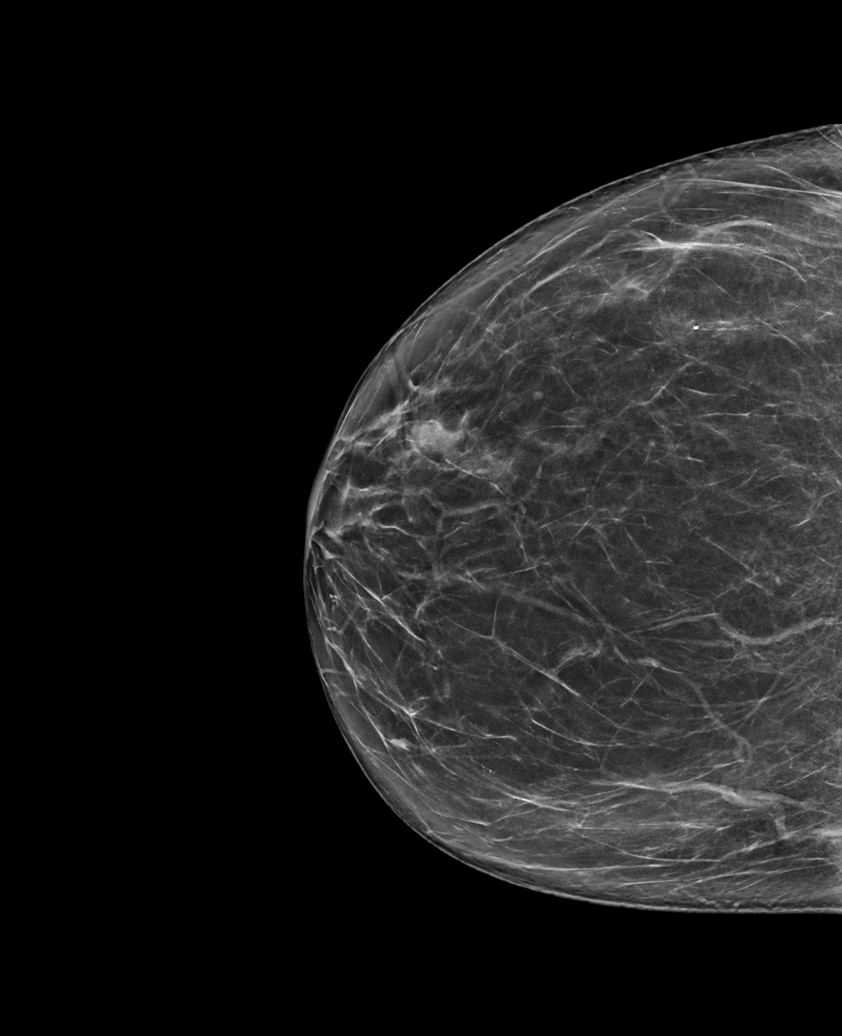

[L CC tomo · tomo slice 44/87.0]
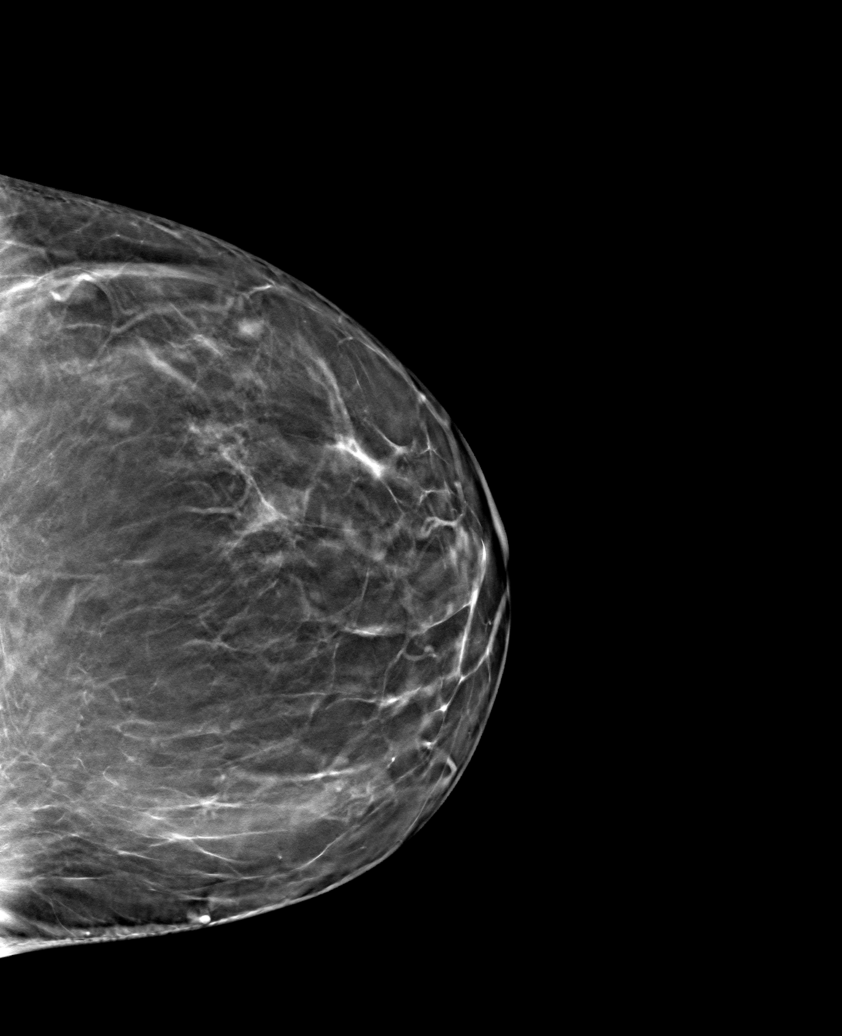

[6 of 30 positions shown; findings below may reference images not displayed]

ACR Breast Density Category b: There are scattered areas of
fibroglandular density.
FINDINGS: There are no findings suspicious for malignancy. The images were
evaluated with computer-aided detection.
IMPRESSION: No mammographic evidence of malignancy. A result letter of this
screening mammogram will be mailed directly to the patient.

RECOMMENDATION:
Screening mammogram in one year. (Code:WJ-I-BG6)

BI-RADS CATEGORY  1: Negative.

## 2022-04-04 ENCOUNTER — Other Ambulatory Visit (HOSPITAL_COMMUNITY)
Admission: RE | Admit: 2022-04-04 | Discharge: 2022-04-04 | Disposition: A | Discharge status: Home / Self Care | Payer: Commercial Managed Care - PPO | Source: Ambulatory Visit | Attending: Family Medicine | Admitting: Family Medicine

## 2022-04-04 ENCOUNTER — Other Ambulatory Visit: Payer: Self-pay | Admitting: Family Medicine

## 2022-04-04 DIAGNOSIS — Z01419 Encounter for gynecological examination (general) (routine) without abnormal findings: Secondary | ICD-10-CM | POA: Insufficient documentation

## 2022-04-08 LAB — CYTOLOGY - PAP
Comment: NEGATIVE
Diagnosis: NEGATIVE
High risk HPV: NEGATIVE

## 2022-10-10 ENCOUNTER — Other Ambulatory Visit: Payer: Self-pay | Admitting: Family Medicine

## 2022-10-10 DIAGNOSIS — Z1231 Encounter for screening mammogram for malignant neoplasm of breast: Secondary | ICD-10-CM

## 2022-11-18 ENCOUNTER — Ambulatory Visit
Admission: RE | Admit: 2022-11-18 | Discharge: 2022-11-18 | Disposition: A | Discharge status: Home / Self Care | Payer: Commercial Managed Care - PPO | Source: Ambulatory Visit | Attending: Family Medicine | Admitting: Family Medicine

## 2022-11-18 DIAGNOSIS — Z1231 Encounter for screening mammogram for malignant neoplasm of breast: Secondary | ICD-10-CM

## 2023-01-13 ENCOUNTER — Ambulatory Visit
Admission: RE | Admit: 2023-01-13 | Discharge: 2023-01-13 | Disposition: A | Discharge status: Home / Self Care | Payer: Commercial Managed Care - PPO | Source: Ambulatory Visit | Attending: Family Medicine | Admitting: Family Medicine

## 2023-01-13 ENCOUNTER — Other Ambulatory Visit: Payer: Self-pay | Admitting: Family Medicine

## 2023-01-13 DIAGNOSIS — M25561 Pain in right knee: Secondary | ICD-10-CM

## 2023-10-20 ENCOUNTER — Other Ambulatory Visit: Payer: Self-pay | Admitting: Family Medicine

## 2023-10-20 DIAGNOSIS — Z1231 Encounter for screening mammogram for malignant neoplasm of breast: Secondary | ICD-10-CM

## 2023-11-24 ENCOUNTER — Ambulatory Visit
Admission: RE | Admit: 2023-11-24 | Discharge: 2023-11-24 | Disposition: A | Discharge status: Home / Self Care | Source: Ambulatory Visit | Attending: Family Medicine | Admitting: Family Medicine

## 2023-11-24 DIAGNOSIS — Z1231 Encounter for screening mammogram for malignant neoplasm of breast: Secondary | ICD-10-CM
